# Patient Record
Sex: Male | Born: 1995 | Race: White | Hispanic: No | Marital: Single | Smoking: Never smoker
Health system: Southern US, Community
[De-identification: ages and names within clinical notes are randomized; demographics above are authoritative.]

---
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
## 2013-01-25 ENCOUNTER — Ambulatory Visit: Payer: Self-pay | Admitting: Family Medicine

## 2013-04-24 ENCOUNTER — Ambulatory Visit: Payer: Self-pay | Admitting: Family Medicine

## 2013-12-24 ENCOUNTER — Ambulatory Visit: Payer: Self-pay | Admitting: Family Medicine

## 2014-12-20 DIAGNOSIS — D2261 Melanocytic nevi of right upper limb, including shoulder: Secondary | ICD-10-CM | POA: Diagnosis not present

## 2015-08-21 ENCOUNTER — Ambulatory Visit (INDEPENDENT_AMBULATORY_CARE_PROVIDER_SITE_OTHER): Payer: Self-pay | Admitting: Family Medicine

## 2015-08-21 ENCOUNTER — Encounter: Payer: Self-pay | Admitting: Family Medicine

## 2015-08-21 DIAGNOSIS — R599 Enlarged lymph nodes, unspecified: Secondary | ICD-10-CM

## 2015-08-21 DIAGNOSIS — R590 Localized enlarged lymph nodes: Secondary | ICD-10-CM

## 2015-08-21 MED ORDER — DOXYCYCLINE HYCLATE 100 MG PO TABS: 100.0000 mg | ORAL_TABLET | Freq: Two times a day (BID) | ORAL | 0 refills | Status: DC

## 2015-08-21 NOTE — Progress Notes (Signed)
Patient presents today with symptoms of tender lymph nodes in the right inguinal area. Patient states that he has had these symptoms for the last 2 days. He states that the team was doing workouts in the grass earlier in the week. He believes he got bit by some mosquitoes, is unsure about ticks. Denies any fever or chills. Denies being sexually active recently. Denies any urinary symptoms or penile discharge. Denies any joint pain or headache. He did take an antihistamine for the insect bites. Denies any URI symptoms, weight loss or extreme fatigue.  ROS: Negative except mentioned above. Vitals as per Epic.  GENERAL: NAD HEENT: no pharyngeal erythema, no exudate, no erythema of TMs, no cervical LAD RESP: CTA B CARD: RRR SKIN: a few small erythematous lesions on right leg and upper extremities, no discharge from the sites, no streaks GU: two slightly tender right inquinal lymph nodes palpated, no genital lesions appreciated, no penile discharge  NEURO: CN II-XII grossly intact   A/P: R Inquinal Lymphadenopathy- likely related to insect bites, will treat with doxycycline for 10 days, I have recommended that the patient follow up with me if any new symptoms develop or if the lymphadenopathy does not resolve. Patient will take antihistamine if needed for itching. I advised the patient to wear appropriate clothing is out in the grass/field.

## 2015-08-29 ENCOUNTER — Ambulatory Visit (INDEPENDENT_AMBULATORY_CARE_PROVIDER_SITE_OTHER): Payer: Self-pay | Admitting: Family Medicine

## 2015-08-29 ENCOUNTER — Encounter: Payer: Self-pay | Admitting: Family Medicine

## 2015-08-29 VITALS — BP 126/46 | HR 72 | Temp 97.9°F

## 2015-08-29 DIAGNOSIS — R5383 Other fatigue: Secondary | ICD-10-CM

## 2015-08-29 NOTE — Progress Notes (Signed)
Patient presents today with symptoms of fatigue. Patient states that he's felt this way for the last few weeks. He admits to having what sounds like a viral illness [place] the [place] weeks ago. He presented over a week ago with right inguinal lymphadenopathy. It appeared that he had some insect bites on the right leg which prompted me to start him on doxycycline. Patient states that he still has a few pills remaining to take. The inguinal areas no longer tender in the lymph node has gotten smaller. He denies any fever, chills, sore throat or any weight loss.  ROS: Negative except mentioned above.  Vitals as per Epic.  GENERAL: NAD HEENT: no pharyngeal erythema, no exudate, no erythema of TMs, no cervical LAD RESP: CTA B CARD: RRR ABD: +BS, NT, LAD in the right inquinal area has decreased in size and is no longer tender  NEURO: CN II-XII grossly intact   A/P: Fatigue, Lymphadenopathy - will do some basic lab work including CBC, TSH, Lyme and RMSF antibodies, EBV antibodies, Vitamin D. Patient will follow up with me after lab results are back. Seek medical attention if any symptoms worsen. Encourage patient to complete course of doxycycline and to follow up with me if symptoms persist or worsen.

## 2015-09-03 LAB — LYME, WESTERN BLOT, SERUM (REFLEXED)
IGG P18 AB.: ABSENT
IGG P23 AB.: ABSENT
IGG P28 AB.: ABSENT
IGG P30 AB.: ABSENT
IGG P39 AB.: ABSENT
IGG P93 AB.: ABSENT
IGM P39 AB.: ABSENT
IgM P23 Ab.: ABSENT
Lyme IgG Wb: NEGATIVE
Lyme IgM Wb: NEGATIVE

## 2015-09-03 LAB — CBC WITH DIFFERENTIAL/PLATELET
BASOS ABS: 0 10*3/uL (ref 0.0–0.2)
BASOS: 1 %
EOS (ABSOLUTE): 0.1 10*3/uL (ref 0.0–0.4)
EOS: 2 %
HEMATOCRIT: 45.4 % (ref 37.5–51.0)
HEMOGLOBIN: 14.9 g/dL (ref 12.6–17.7)
IMMATURE GRANS (ABS): 0 10*3/uL (ref 0.0–0.1)
Immature Granulocytes: 0 %
LYMPHS ABS: 1.7 10*3/uL (ref 0.7–3.1)
LYMPHS: 30 %
MCH: 29 pg (ref 26.6–33.0)
MCHC: 32.8 g/dL (ref 31.5–35.7)
MCV: 88 fL (ref 79–97)
MONOCYTES: 7 %
Monocytes Absolute: 0.4 10*3/uL (ref 0.1–0.9)
NEUTROS PCT: 60 %
Neutrophils Absolute: 3.5 10*3/uL (ref 1.4–7.0)
Platelets: 215 10*3/uL (ref 150–379)
RBC: 5.14 x10E6/uL (ref 4.14–5.80)
RDW: 13 % (ref 12.3–15.4)
WBC: 5.7 10*3/uL (ref 3.4–10.8)

## 2015-09-03 LAB — EPSTEIN-BARR VIRUS VCA ANTIBODY PANEL
EBV EARLY ANTIGEN AB, IGG: 16.4 U/mL — AB (ref 0.0–8.9)
EBV VCA IgG: 315 U/mL — ABNORMAL HIGH (ref 0.0–17.9)

## 2015-09-03 LAB — ROCKY MTN SPOTTED FVR ABS PNL(IGG+IGM)
RMSF IgG: NEGATIVE
RMSF IgM: 0.44 index (ref 0.00–0.89)

## 2015-09-03 LAB — VITAMIN D 25 HYDROXY (VIT D DEFICIENCY, FRACTURES): Vit D, 25-Hydroxy: 37.1 ng/mL (ref 30.0–100.0)

## 2015-09-03 LAB — B. BURGDORFI ANTIBODIES: LYME IGG/IGM AB: 1.31 {ISR} — AB (ref 0.00–0.90)

## 2015-09-03 LAB — TSH: TSH: 2.39 u[IU]/mL (ref 0.450–4.500)

## 2015-09-04 ENCOUNTER — Encounter: Payer: Self-pay | Admitting: Family Medicine

## 2015-09-04 ENCOUNTER — Ambulatory Visit (INDEPENDENT_AMBULATORY_CARE_PROVIDER_SITE_OTHER): Payer: Self-pay | Admitting: Family Medicine

## 2015-09-04 DIAGNOSIS — A6920 Lyme disease, unspecified: Secondary | ICD-10-CM

## 2015-09-04 MED ORDER — DOXYCYCLINE HYCLATE 100 MG PO TABS: 100.0000 mg | ORAL_TABLET | Freq: Two times a day (BID) | ORAL | 0 refills | Status: DC

## 2015-09-04 NOTE — Progress Notes (Signed)
Patient presents today for review of his blood work that was done recently regarding his fatigue and lymphadenopathy. It appears that his western blot Lyme disease test was positive. He completed his ten-day course of the Doxycycline. I will extend his course for 4 more days. He states that his right inguinal lymphadenopathy has resolved. His fatigue has improved as well. He denies any other symptoms at this time. I have advised the patient to follow up with me if he has any further problems. Discussed possibility of doxycycline causing a rash to sun exposed areas. Proper clothing discussed.

## 2015-10-08 ENCOUNTER — Ambulatory Visit
Admission: RE | Admit: 2015-10-08 | Discharge: 2015-10-08 | Disposition: A | Discharge status: Home / Self Care | Payer: 59 | Source: Ambulatory Visit | Attending: Family Medicine | Admitting: Family Medicine

## 2015-10-08 ENCOUNTER — Other Ambulatory Visit: Payer: Self-pay | Admitting: Family Medicine

## 2015-10-08 DIAGNOSIS — M25561 Pain in right knee: Secondary | ICD-10-CM | POA: Insufficient documentation

## 2015-10-09 ENCOUNTER — Other Ambulatory Visit: Payer: Self-pay | Admitting: Family Medicine

## 2015-10-09 ENCOUNTER — Ambulatory Visit (INDEPENDENT_AMBULATORY_CARE_PROVIDER_SITE_OTHER): Payer: 59 | Admitting: Family Medicine

## 2015-10-09 DIAGNOSIS — M25561 Pain in right knee: Secondary | ICD-10-CM

## 2015-10-09 MED ORDER — NAPROXEN 500 MG PO TABS: 500.0000 mg | ORAL_TABLET | Freq: Two times a day (BID) | ORAL | 0 refills | Status: AC

## 2015-10-09 NOTE — Progress Notes (Signed)
Patient presents today with symptoms of right knee pain. Patient states that a few days ago and match he planted his foot and got shoved and noticed a pop on the outside aspect of his right knee. He was able to continue playing with some soreness. At the end of the match he was stretching and noticed a locking of the knee. He has had some minimal effusion. He states that his symptoms have only slightly improved since that day. He is able to walk without any significant pain. He denies any previous history of knee problems in the past. 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  ROS: Negative except mentioned above.  GENERAL: NAD RESP: CTA B CARD: RRR MSK: R Knee- no significant effusion, FROM, discomfort with full flexion, mild tenderness in the posteriorlateral corner area, +McMurray, -Lachman, -Drawer, no laxity with varus or valgus stress, no tenderness along hamstring area, nv intact  NEURO: CN II-XII grossly intact   A/P: R Knee Injury - x-rays were reviewed, Naprosyn when necessary, will order MRI to evaluate posteriorlateral corner further especially to evaluate for meniscal injury, continue gameready and f/u with trainer daily for interval change.

## 2015-10-10 ENCOUNTER — Ambulatory Visit
Admission: RE | Admit: 2015-10-10 | Discharge: 2015-10-10 | Disposition: A | Discharge status: Home / Self Care | Payer: 59 | Source: Ambulatory Visit | Attending: Family Medicine | Admitting: Family Medicine

## 2015-10-10 DIAGNOSIS — M25561 Pain in right knee: Secondary | ICD-10-CM | POA: Insufficient documentation

## 2015-10-10 DIAGNOSIS — M25461 Effusion, right knee: Secondary | ICD-10-CM | POA: Insufficient documentation

## 2015-10-20 ENCOUNTER — Ambulatory Visit (INDEPENDENT_AMBULATORY_CARE_PROVIDER_SITE_OTHER): Payer: 59 | Admitting: Family Medicine

## 2015-10-20 DIAGNOSIS — S76111A Strain of right quadriceps muscle, fascia and tendon, initial encounter: Secondary | ICD-10-CM

## 2015-10-20 MED ORDER — MELOXICAM 15 MG PO TABS: 15.0000 mg | ORAL_TABLET | Freq: Every day | ORAL | 0 refills | Status: AC

## 2015-10-24 NOTE — Progress Notes (Signed)
Patient states that he was striking a ball a few days ago when he started to have proximal right quadriceps pain. He denies any bruising of the area or obvious swelling. Patient does have a history of a labral injury to the right hip that has not had any surgical intervention. Patient states that this injury feels different than what he has experienced with his labral injury. He states that the symptoms have improved slightly since the injury. He is able to walk without much pain.  ROS: Negative except mentioned above.  Vitals as per Epic.  GENERAL: NAD RESP: CTA B CARD: RRR MSK: no inquinal pain/groin pain to palpation, tenderness to palpation along proximal quadriceps, no defect or ecchymosis appreciated, decreased flexion due to discomfortin the quad, mildly tight hip flexors on the right compared to left, FROM of the hip, nv intact   NEURO: CN II-XII grossly intact   A/P: Right quadriceps strain -NSAIDs when necessary, rehabilitation with trainer, seek medical attention if symptoms persist or worsen as discussed.

## 2015-11-04 ENCOUNTER — Other Ambulatory Visit: Payer: Self-pay | Admitting: Family Medicine

## 2015-11-04 DIAGNOSIS — S73191D Other sprain of right hip, subsequent encounter: Secondary | ICD-10-CM

## 2015-11-05 ENCOUNTER — Ambulatory Visit
Admission: RE | Admit: 2015-11-05 | Discharge: 2015-11-05 | Disposition: A | Discharge status: Home / Self Care | Payer: 59 | Source: Ambulatory Visit | Attending: Family Medicine | Admitting: Family Medicine

## 2015-11-05 DIAGNOSIS — S73191D Other sprain of right hip, subsequent encounter: Secondary | ICD-10-CM

## 2015-11-05 DIAGNOSIS — M25551 Pain in right hip: Secondary | ICD-10-CM | POA: Diagnosis not present

## 2015-11-05 MED ORDER — METHYLPREDNISOLONE ACETATE 40 MG/ML INJ SUSP (RADIOLOG
80.0000 mg | Freq: Once | INTRAMUSCULAR | Status: AC
  Administered 2015-11-05: 80 mg via INTRA_ARTICULAR

## 2015-11-05 MED ORDER — IOPAMIDOL (ISOVUE-300) INJECTION 61%
10.0000 mL | Freq: Once | INTRAVENOUS | Status: AC | PRN
  Administered 2015-11-05: 4 mL

## 2015-11-05 MED ORDER — BUPIVACAINE HCL (PF) 0.25 % IJ SOLN
7.0000 mL | Freq: Once | INTRAMUSCULAR | Status: AC
  Administered 2015-11-05: 7 mL via INTRA_ARTICULAR
  Filled 2015-11-05: qty 10

## 2015-11-05 MED ORDER — LIDOCAINE HCL (PF) 1 % IJ SOLN
5.0000 mL | Freq: Once | INTRAMUSCULAR | Status: AC
  Administered 2015-11-05: 5 mL
  Filled 2015-11-05: qty 5

## 2015-11-20 ENCOUNTER — Ambulatory Visit (INDEPENDENT_AMBULATORY_CARE_PROVIDER_SITE_OTHER): Payer: 59 | Admitting: Family Medicine

## 2015-11-20 ENCOUNTER — Encounter: Payer: Self-pay | Admitting: Family Medicine

## 2015-11-20 DIAGNOSIS — E559 Vitamin D deficiency, unspecified: Secondary | ICD-10-CM

## 2015-11-20 DIAGNOSIS — N442 Benign cyst of testis: Secondary | ICD-10-CM

## 2015-11-20 NOTE — Progress Notes (Signed)
Patient presents today for follow-up regarding his vitamin D level. Patient was seen 3 months ago and some blood work was done which showed a vitamin D level of 37. Patient states that he has been taking a vitamin D supplement since that time. Patient was also seen at that time for lymphadenopathy in the inguinal area on the right side which she was then later treated for with doxycycline given his Lyme titer results. Patient states that the right inguinal lymph node is still present but much smaller. 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 He denies any testicular pain or any penile discharge. He denies any weight loss or fatigue. He is never had any imaging done of his right testicle area. This cyst in the past was felt by another physician. Patient also states that he drinks a lot of water and does urinate frequently. He denies any dysuria or other problems with urination. He denies any history of diabetes or any family history of diabetes. He denies risk of STDs and does not wish to be tested.  ROS: Negative except mentioned above. Vitals as per Epic.  GENERAL: NAD RESP: CTA B CARD: RRR ABD/GU: Small pea-sized lymph node felt in the right inguinal area, this feels only slightly larger than the lymph node felt on the left inguinal area, it is mobile with no erythema on the skin, deferred testicular exam NEURO: CN II-XII grossly intact   A/P: 1)Right inguinal lymph node - this appears to be benign, but given patient's history of being told that he had a testicular cyst will do a testicular ultrasound. We will also do a CMP. CBC results that were done previously were normal.  2)Vitamin D deficiency - we'll repeat vitamin D level today. Will inform patient results when reviewed.

## 2015-11-21 ENCOUNTER — Other Ambulatory Visit: Payer: Self-pay | Admitting: Family Medicine

## 2015-11-21 ENCOUNTER — Ambulatory Visit: Admission: RE | Admit: 2015-11-21 | Payer: 59 | Source: Ambulatory Visit

## 2015-11-21 DIAGNOSIS — R590 Localized enlarged lymph nodes: Secondary | ICD-10-CM

## 2015-11-21 LAB — SPECIMEN STATUS

## 2015-11-25 LAB — COMPREHENSIVE METABOLIC PANEL
A/G RATIO: 1.9 (ref 1.2–2.2)
ALT: 17 IU/L (ref 0–44)
AST: 25 IU/L (ref 0–40)
Albumin: 5 g/dL (ref 3.5–5.5)
Alkaline Phosphatase: 63 IU/L (ref 39–117)
BILIRUBIN TOTAL: 0.9 mg/dL (ref 0.0–1.2)
BUN/Creatinine Ratio: 15 (ref 9–20)
BUN: 18 mg/dL (ref 6–20)
CHLORIDE: 100 mmol/L (ref 96–106)
CO2: 23 mmol/L (ref 18–29)
Calcium: 9.4 mg/dL (ref 8.7–10.2)
Creatinine, Ser: 1.23 mg/dL (ref 0.76–1.27)
GFR calc non Af Amer: 83 mL/min/{1.73_m2} (ref >59)
GFR, EST AFRICAN AMERICAN: 96 mL/min/{1.73_m2} (ref >59)
GLOBULIN, TOTAL: 2.6 g/dL (ref 1.5–4.5)
Glucose: 86 mg/dL (ref 65–99)
POTASSIUM: 4.3 mmol/L (ref 3.5–5.2)
SODIUM: 139 mmol/L (ref 134–144)
Total Protein: 7.6 g/dL (ref 6.0–8.5)

## 2015-11-25 LAB — VITAMIN D 25 HYDROXY (VIT D DEFICIENCY, FRACTURES): VIT D 25 HYDROXY: 48.1 ng/mL (ref 30.0–100.0)

## 2015-12-01 ENCOUNTER — Other Ambulatory Visit: Payer: Self-pay | Admitting: Family Medicine

## 2015-12-01 DIAGNOSIS — N442 Benign cyst of testis: Secondary | ICD-10-CM

## 2015-12-02 ENCOUNTER — Ambulatory Visit
Admission: RE | Admit: 2015-12-02 | Discharge: 2015-12-02 | Disposition: A | Discharge status: Home / Self Care | Payer: 59 | Source: Ambulatory Visit | Attending: Family Medicine | Admitting: Family Medicine

## 2015-12-02 DIAGNOSIS — N442 Benign cyst of testis: Secondary | ICD-10-CM | POA: Diagnosis present

## 2016-05-03 ENCOUNTER — Ambulatory Visit: Payer: 59 | Admitting: Family Medicine

## 2016-05-03 ENCOUNTER — Ambulatory Visit
Admission: RE | Admit: 2016-05-03 | Discharge: 2016-05-03 | Disposition: A | Discharge status: Home / Self Care | Payer: 59 | Source: Ambulatory Visit | Attending: Family Medicine | Admitting: Family Medicine

## 2016-05-03 ENCOUNTER — Other Ambulatory Visit: Payer: Self-pay | Admitting: Family Medicine

## 2016-05-03 DIAGNOSIS — M79604 Pain in right leg: Secondary | ICD-10-CM | POA: Insufficient documentation

## 2016-05-05 ENCOUNTER — Other Ambulatory Visit: Payer: Self-pay | Admitting: Family Medicine

## 2016-05-05 DIAGNOSIS — M79651 Pain in right thigh: Secondary | ICD-10-CM

## 2016-05-07 ENCOUNTER — Ambulatory Visit
Admission: RE | Admit: 2016-05-07 | Discharge: 2016-05-07 | Disposition: A | Discharge status: Home / Self Care | Payer: 59 | Source: Ambulatory Visit | Attending: Family Medicine | Admitting: Family Medicine

## 2016-05-07 DIAGNOSIS — X58XXXA Exposure to other specified factors, initial encounter: Secondary | ICD-10-CM | POA: Insufficient documentation

## 2016-05-07 DIAGNOSIS — S73192A Other sprain of left hip, initial encounter: Secondary | ICD-10-CM | POA: Insufficient documentation

## 2016-05-07 DIAGNOSIS — M79651 Pain in right thigh: Secondary | ICD-10-CM

## 2016-05-17 ENCOUNTER — Ambulatory Visit (INDEPENDENT_AMBULATORY_CARE_PROVIDER_SITE_OTHER): Payer: 59 | Admitting: Family Medicine

## 2016-05-17 ENCOUNTER — Encounter: Payer: Self-pay | Admitting: Family Medicine

## 2016-05-17 DIAGNOSIS — S76191D Other specified injury of right quadriceps muscle, fascia and tendon, subsequent encounter: Secondary | ICD-10-CM

## 2016-05-17 DIAGNOSIS — R5383 Other fatigue: Secondary | ICD-10-CM

## 2016-05-17 NOTE — Progress Notes (Signed)
Patient presents today to follow-up on MRI results. An MRI reviewed by Dr. Kathreen Devoid [facility] radiologist who suggested images looked like healing muscle strain or  partial tear at the proximal MTJ of rectus femoris.   Patient states that the area is not painful on loss he strikes the ball across his body with his right leg. He states that he is about 80% improved from when his initial injury happened back in late September 2017. Patient states that he truly has no pain over the area on palpation. He has a job in [place] after he graduates this year. 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 Patient also states that he has been having fatigue. He is unsure whether he really got over his fatigue last year. Labs were drawn and he was treated for tick borne illness. This is the first that I'm hearing about this fatigue again since he was treated previously. He denies any weight issues or sleep issues. He denies taking any new medications. He denies any depression or anxiety. He denies taking any new supplements. He denies any chest pain, shortness of breath, abdominal pain. He denies any insect bites or tick bites recently.  ROS: Negative except mentioned above. Vitals as per Epic. GENERAL: NAD HEENT: no pharyngeal erythema, no exudate, no erythema of TMs, no cervical LAD RESP: CTA B CARD: RRR NEURO: CN II-XII grossly intact   A/P: 1) Previous right quad injury - imaging did not show myositis ossificans, discussed the results of the MRI with patient, if patient wants to be aggressive can try something like PRP, patient states that he will research the procedure and see if he is interested in doing it. Can continue to do stretching and strengthening as discussed. He has improved since his initial injury and has no problems with daily activities. Will follow-up with me or Dr. Gerald Dexter if needed.  2) Fatigue - will do some basic labs, if symptoms do persist or worsen will need further workup by  primary care physician.  * Patient had a vasovagal experience when having his blood drawn in a seated position. The CMA and myself placed him on the ground because we were unable to get him to the examining table. Patient was put on the ground and his legs were elevated. Patient then regained consciousness and denied any problems such as headache, neck pain, dizziness. Vitals were checked and patient remained in the office for at least 20 minutes. Patient was given food and beverage prior to leaving. He was able to tolerate this without any problems. He admitted he was fine before he left the office.

## 2016-05-19 LAB — CBC WITH DIFFERENTIAL/PLATELET
BASOS: 1 %
Basophils Absolute: 0 10*3/uL (ref 0.0–0.2)
EOS (ABSOLUTE): 0.3 10*3/uL (ref 0.0–0.4)
EOS: 4 %
HEMATOCRIT: 47.7 % (ref 37.5–51.0)
HEMOGLOBIN: 16.1 g/dL (ref 13.0–17.7)
IMMATURE GRANS (ABS): 0 10*3/uL (ref 0.0–0.1)
IMMATURE GRANULOCYTES: 0 %
LYMPHS: 36 %
Lymphocytes Absolute: 2.3 10*3/uL (ref 0.7–3.1)
MCH: 29.5 pg (ref 26.6–33.0)
MCHC: 33.8 g/dL (ref 31.5–35.7)
MCV: 88 fL (ref 79–97)
MONOS ABS: 0.4 10*3/uL (ref 0.1–0.9)
Monocytes: 6 %
NEUTROS PCT: 53 %
Neutrophils Absolute: 3.3 10*3/uL (ref 1.4–7.0)
Platelets: 215 10*3/uL (ref 150–379)
RBC: 5.45 x10E6/uL (ref 4.14–5.80)
RDW: 13.3 % (ref 12.3–15.4)
WBC: 6.3 10*3/uL (ref 3.4–10.8)

## 2016-05-19 LAB — COMPREHENSIVE METABOLIC PANEL
A/G RATIO: 1.8 (ref 1.2–2.2)
ALT: 12 IU/L (ref 0–44)
AST: 20 IU/L (ref 0–40)
Albumin: 4.9 g/dL (ref 3.5–5.5)
Alkaline Phosphatase: 64 IU/L (ref 39–117)
BUN / CREAT RATIO: 17 (ref 9–20)
BUN: 17 mg/dL (ref 6–20)
Bilirubin Total: 0.6 mg/dL (ref 0.0–1.2)
CALCIUM: 9.3 mg/dL (ref 8.7–10.2)
CO2: 21 mmol/L (ref 18–29)
CREATININE: 1.03 mg/dL (ref 0.76–1.27)
Chloride: 101 mmol/L (ref 96–106)
GFR, EST AFRICAN AMERICAN: 119 mL/min/{1.73_m2} (ref >59)
GFR, EST NON AFRICAN AMERICAN: 103 mL/min/{1.73_m2} (ref >59)
GLOBULIN, TOTAL: 2.8 g/dL (ref 1.5–4.5)
Glucose: 87 mg/dL (ref 65–99)
POTASSIUM: 4.1 mmol/L (ref 3.5–5.2)
SODIUM: 143 mmol/L (ref 134–144)
TOTAL PROTEIN: 7.7 g/dL (ref 6.0–8.5)

## 2016-05-19 LAB — ROCKY MTN SPOTTED FVR ABS PNL(IGG+IGM)
RMSF IgG: NEGATIVE
RMSF IgM: 0.37 index (ref 0.00–0.89)

## 2016-05-19 LAB — SEDIMENTATION RATE: Sed Rate: 2 mm/hr (ref 0–15)

## 2016-05-19 LAB — B. BURGDORFI ANTIBODIES

## 2016-05-19 LAB — VITAMIN D 25 HYDROXY (VIT D DEFICIENCY, FRACTURES): Vit D, 25-Hydroxy: 31.4 ng/mL (ref 30.0–100.0)

## 2016-05-19 LAB — TSH: TSH: 3.91 u[IU]/mL (ref 0.450–4.500)

## 2016-12-29 DIAGNOSIS — Z0000 Encounter for general adult medical examination without abnormal findings: Secondary | ICD-10-CM | POA: Diagnosis not present

## 2017-05-26 DIAGNOSIS — M79651 Pain in right thigh: Secondary | ICD-10-CM | POA: Diagnosis not present

## 2017-05-27 ENCOUNTER — Other Ambulatory Visit: Payer: Self-pay | Admitting: Sports Medicine

## 2017-05-27 DIAGNOSIS — M79651 Pain in right thigh: Secondary | ICD-10-CM

## 2017-05-30 ENCOUNTER — Ambulatory Visit
Admission: RE | Admit: 2017-05-30 | Discharge: 2017-05-30 | Disposition: A | Discharge status: Home / Self Care | Payer: 59 | Source: Ambulatory Visit | Attending: Sports Medicine | Admitting: Sports Medicine

## 2017-05-30 DIAGNOSIS — M79651 Pain in right thigh: Secondary | ICD-10-CM

## 2017-06-02 DIAGNOSIS — M79651 Pain in right thigh: Secondary | ICD-10-CM | POA: Diagnosis not present

## 2017-06-08 DIAGNOSIS — S76811A Strain of other specified muscles, fascia and tendons at thigh level, right thigh, initial encounter: Secondary | ICD-10-CM | POA: Diagnosis not present

## 2018-04-04 IMAGING — MR MR KNEE*R* W/O CM
6 series · 35 of 40 positions shown · non-contrast
Comparison: None.

CLINICAL DATA: Twisting injury 3 days ago. Posterior and lateral
pain.

EXAM:
MRI OF THE RIGHT KNEE WITHOUT CONTRAST
TECHNIQUE: Multiplanar, multisequence MR imaging of the knee was performed. No
intravenous contrast was administered.

[Series 3: PD fat-sat · axial · 3.0mm · 0.50mm/px · z∈[-30,+82]mm · 7 of 35 slices shown (1 of 4)]
[im 1/35]
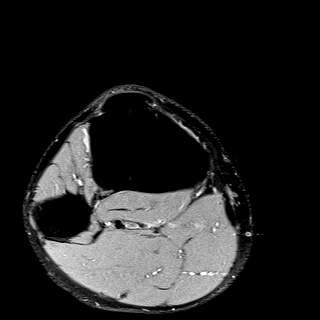
[im 6/35]
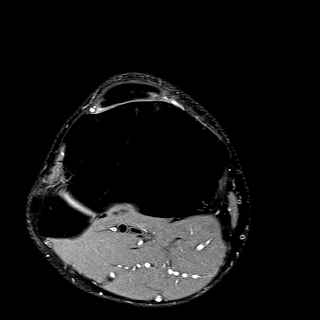
[im 12/35]
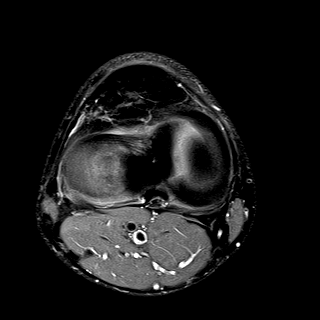
[im 18/35]
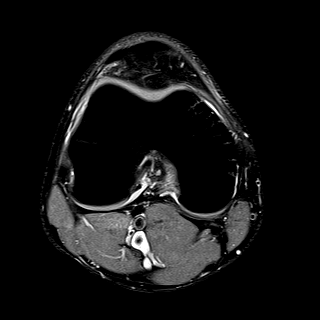
[im 23/35]
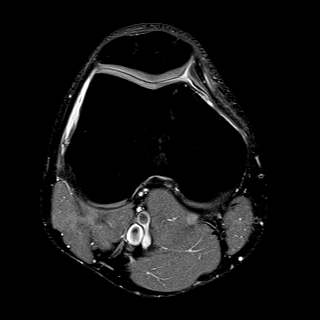
[im 29/35]
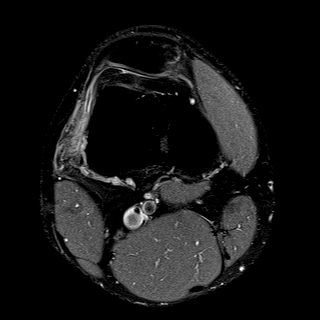
[im 35/35]
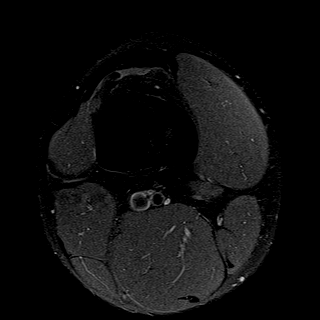

[Series 4: T1 · coronal · 3.0mm · 0.50mm/px · 3 of 32 slices shown]
[im 1/32]
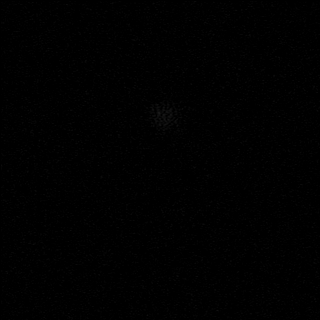
[im 5/32]
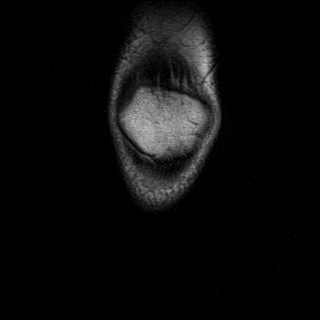
[im 9/32]
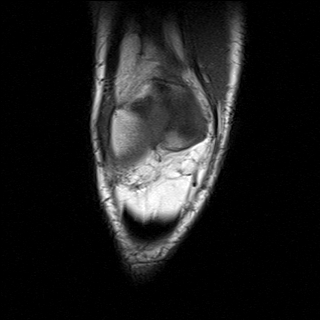

[Series 5: PD fat-sat · sagittal · 3.0mm · 0.50mm/px · 7 of 30 slices shown (2 of 4)]
[im 1/30]
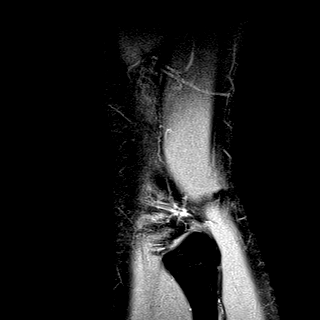
[im 5/30]
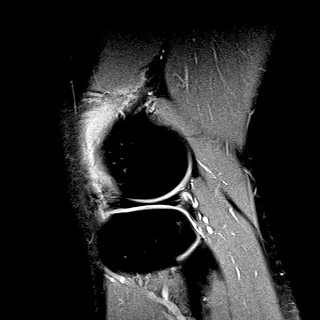
[im 10/30]
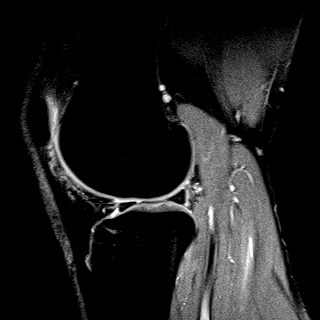
[im 15/30]
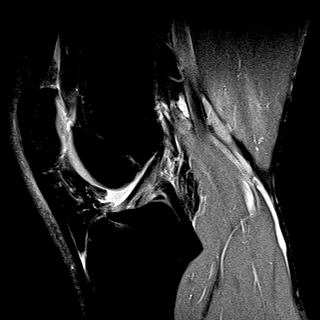
[im 20/30]
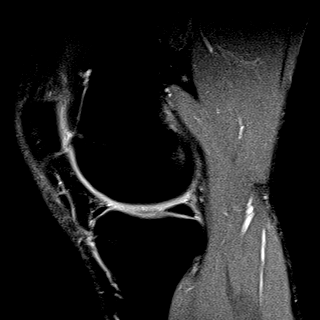
[im 25/30]
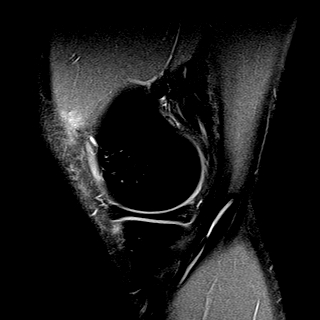
[im 30/30]
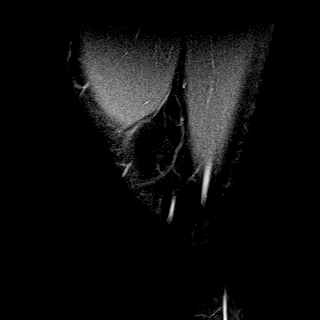

[Series 6: T2 fat-sat · coronal · 3.0mm · 0.31mm/px · 8 of 32 slices shown]
[im 1/32]
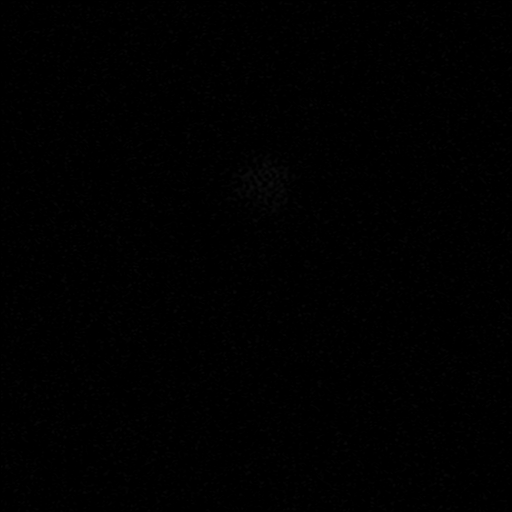
[im 5/32]
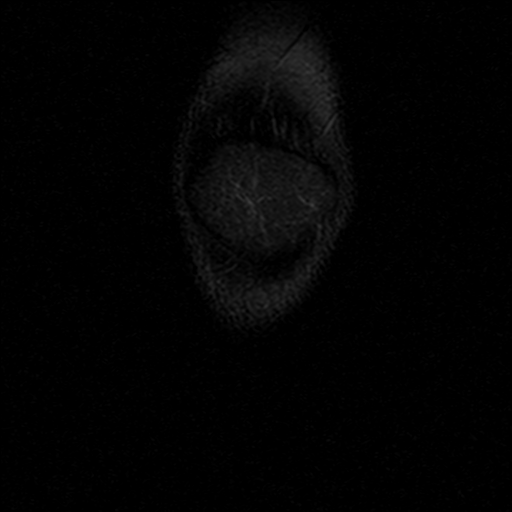
[im 9/32]
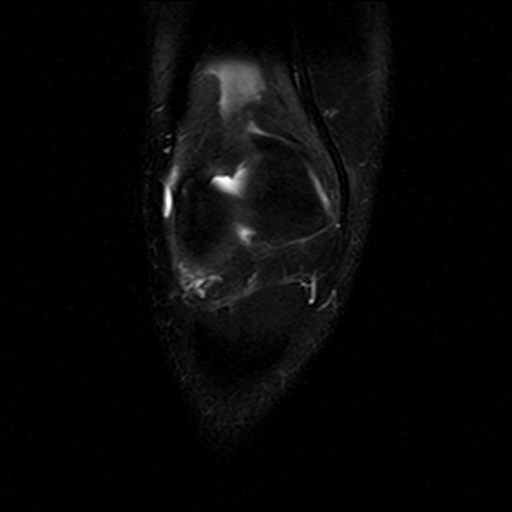
[im 14/32]
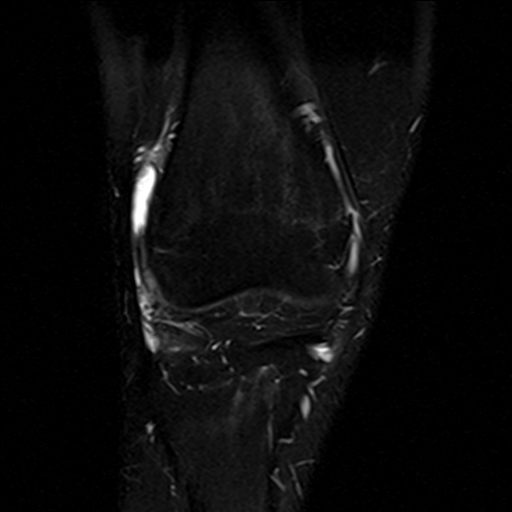
[im 18/32]
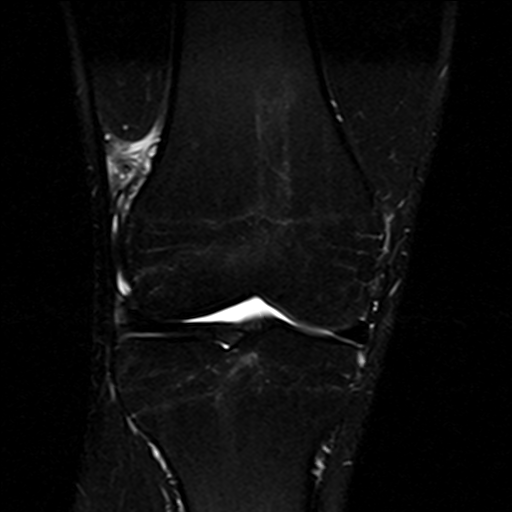
[im 23/32]
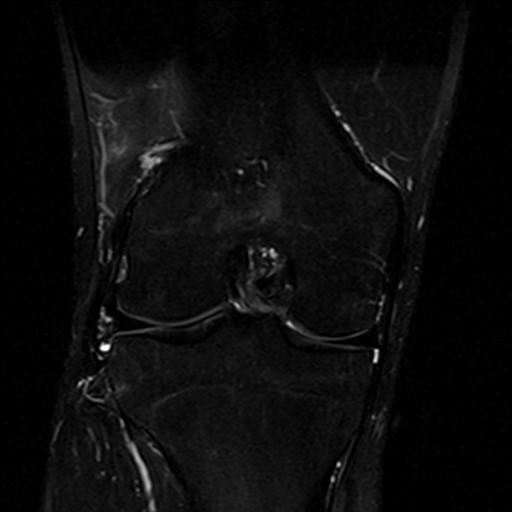
[im 27/32]
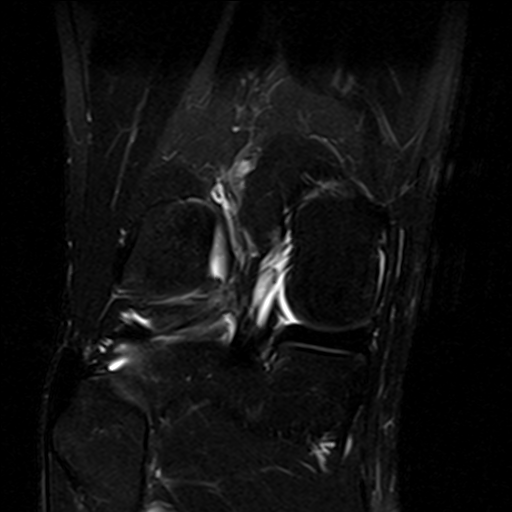
[im 32/32]
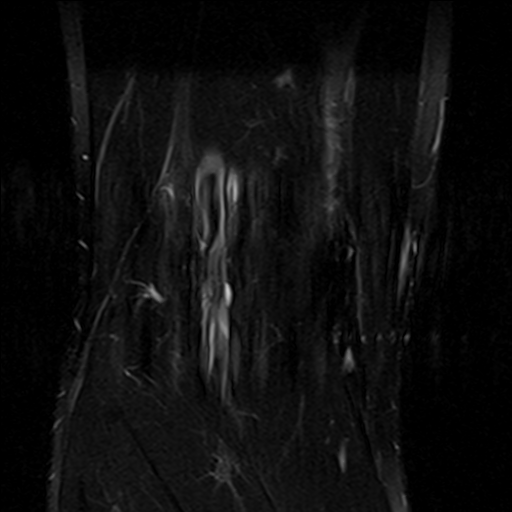

[Series 7: PD fat-sat · coronal · 3.0mm · 0.50mm/px · 8 of 32 slices shown (3 of 4)]
[im 1/32]
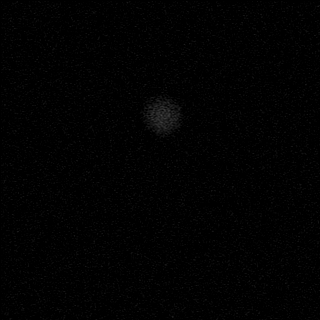
[im 5/32]
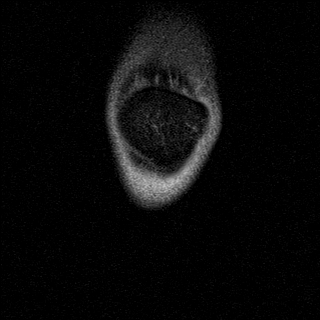
[im 9/32]
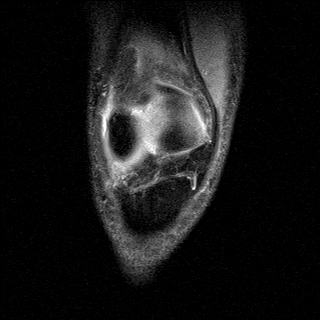
[im 14/32]
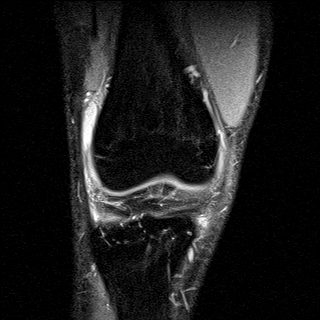
[im 18/32]
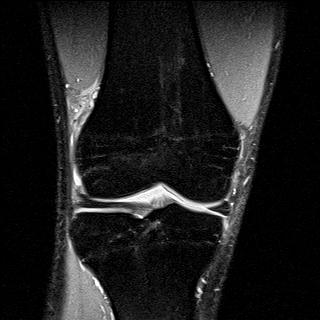
[im 23/32]
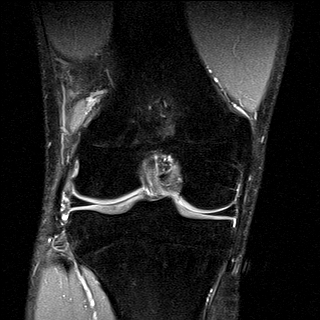
[im 27/32]
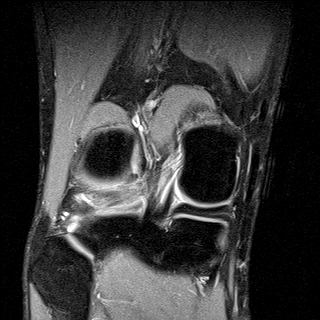
[im 32/32]
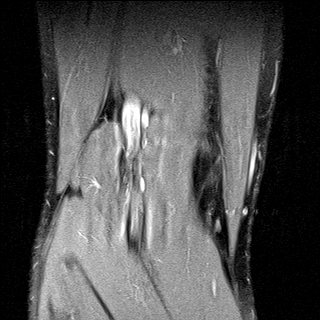

[Series 8: PD fat-sat · oblique · 2.0mm · 0.62mm/px · 2 of 7 slices shown (4 of 4)]
[im 1/7]
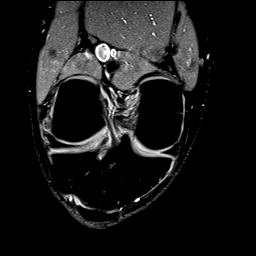
[im 7/7]
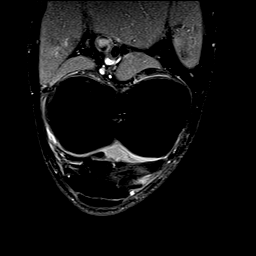

[35 of 40 positions shown; findings below may reference images not displayed]

FINDINGS: MENISCI

Medial meniscus:  Intact.

Lateral meniscus:  Intact.

LIGAMENTS

Cruciates:  Intact ACL and PCL.

Collaterals: Medial collateral ligament is intact. Lateral
collateral ligament complex is intact.

CARTILAGE

Patellofemoral:  No chondral defect.

Medial:  No chondral defect.

Lateral:  No chondral defect.

Joint: No joint effusion. Mild edema in superolateral Hoffa's fat.
No plical thickening.

Popliteal Fossa:  No Baker cyst.  Intact popliteus tendon.

Extensor Mechanism: Intact quadriceps tendon and patellar tendon.
Intact medial and lateral patellar retinaculum.

Bones:  No marrow signal abnormality.  No fracture or dislocation.

Other: Soft tissue edema just inferior to the muscular tendinous
junction of the vastus lateralis muscle which may reflect direct
contusion.
IMPRESSION: 1. No meniscal or ligamentous injury of the right knee.
2. Edema in superolateral Hoffa's fat as can be seen with patellar
tendo -lateral femoral condyle friction syndrome.

## 2018-05-15 ENCOUNTER — Other Ambulatory Visit: Payer: Self-pay

## 2018-05-15 ENCOUNTER — Ambulatory Visit (INDEPENDENT_AMBULATORY_CARE_PROVIDER_SITE_OTHER): Payer: 59 | Admitting: Family Medicine

## 2018-05-15 DIAGNOSIS — J4520 Mild intermittent asthma, uncomplicated: Secondary | ICD-10-CM | POA: Diagnosis not present

## 2018-05-15 NOTE — Progress Notes (Signed)
Patient ID: Bryan Stewart, male   DOB: July 31, 1995, 23 y.o.   MRN: [id number]  This visit type was conducted due to national recommendations for restrictions regarding the COVID-19 pandemic in an effort to limit this patient's exposure and mitigate transmission in our community.   Virtual Visit via Video Note  I connected with Bryan Stewart on 05/15/18 at  1:45 PM EDT by a video enabled telemedicine application and verified that I am speaking with the correct person using two identifiers.  Location patient: home Location provider:work or home office Persons participating in the virtual visit: patient, provider  I discussed the limitations of evaluation and management by telemedicine and the availability of in person appointments. The patient expressed understanding and agreed to proceed.   HPI: Patient set up today's visit to establish care.  He gradually started Celexa for years.  He works as a Engineer, maintenance (IT) with a Nurse, learning disability.  Non-smoker.  Runs for exercise.  He has history of mild intermittent asthma from childhood.  He has not had a flareup and has not had to use a rescue inhaler for several years.  He is originally from [place].  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  Not growing in size.  Not tender.  Nonbleeding.  Very regular features.   ROS: See pertinent positives and negatives per HPI.  No past medical history on file.  Past Surgical History:  Procedure Laterality Date  . LABRAL REPAIR Right 10/2011   Right Shoulder    No family history on file.  SOCIAL HX: Non-smoker.  Graduated from [employer].  Played soccer there.  Works as a Engineer, maintenance (IT)   Current Outpatient Medications:  .  meloxicam (MOBIC) 15 MG tablet, Take 1 tablet (15 mg total) by mouth daily., Disp: 14 tablet, Rfl: 0 .  naproxen (NAPROSYN) 500 MG tablet, Take 1 tablet (500 mg total) by mouth 2 (two) times daily with a meal., Disp:  20 tablet, Rfl: 0  EXAM:  VITALS per patient if applicable:  GENERAL: alert, oriented, appears well and in no acute distress  HEENT: atraumatic, conjunttiva clear, no obvious abnormalities on inspection of external nose and ears  NECK: normal movements of the head and neck  LUNGS: on inspection no signs of respiratory distress, breathing rate appears normal, no obvious gross SOB, gasping or wheezing  CV: no obvious cyanosis  MS: moves all visible extremities without noticeable abnormality  PSYCH/NEURO: pleasant and cooperative, no obvious depression or anxiety, speech and thought processing grossly intact  ASSESSMENT AND PLAN:  Discussed the following assessment and plan:  Mild intermittent asthma without complication  -Continue rescue inhaler as needed  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 -We reviewed signs and symptoms of abnormal Gross.  Follow-up for any growth or other changes.  We advised this be looked at in office once things are open back up if he has any concerns or questions.     I discussed the assessment and treatment plan with the patient. The patient was provided an opportunity to ask questions and all were answered. The patient agreed with the plan and demonstrated an understanding of the instructions.   The patient was advised to call back or seek an in-person evaluation if the symptoms worsen or if the condition fails to improve as anticipated.   Carolann Littler, MD

## 2018-05-30 ENCOUNTER — Encounter: Payer: Self-pay | Admitting: Family Medicine
